# Patient Record
Sex: Female | Born: 1940 | Race: Black or African American | Hispanic: No | Marital: Single | Smoking: Never smoker
Health system: Southern US, Community
[De-identification: ages and names within clinical notes are randomized; demographics above are authoritative.]

---

## 2014-05-25 ENCOUNTER — Emergency Department (HOSPITAL_COMMUNITY): Payer: Self-pay

## 2014-05-25 ENCOUNTER — Encounter (HOSPITAL_COMMUNITY): Payer: Self-pay | Admitting: Emergency Medicine

## 2014-05-25 ENCOUNTER — Emergency Department (HOSPITAL_COMMUNITY)
Admission: EM | Admit: 2014-05-25 | Discharge: 2014-05-25 | Disposition: A | Discharge status: Home / Self Care | Payer: Self-pay | Attending: Emergency Medicine | Admitting: Emergency Medicine

## 2014-05-25 DIAGNOSIS — R630 Anorexia: Secondary | ICD-10-CM | POA: Insufficient documentation

## 2014-05-25 DIAGNOSIS — H578 Other specified disorders of eye and adnexa: Secondary | ICD-10-CM | POA: Insufficient documentation

## 2014-05-25 DIAGNOSIS — R05 Cough: Secondary | ICD-10-CM | POA: Insufficient documentation

## 2014-05-25 DIAGNOSIS — R51 Headache: Secondary | ICD-10-CM | POA: Insufficient documentation

## 2014-05-25 DIAGNOSIS — N390 Urinary tract infection, site not specified: Secondary | ICD-10-CM | POA: Insufficient documentation

## 2014-05-25 DIAGNOSIS — R5383 Other fatigue: Secondary | ICD-10-CM | POA: Insufficient documentation

## 2014-05-25 LAB — URINE MICROSCOPIC-ADD ON

## 2014-05-25 LAB — CBC WITH DIFFERENTIAL/PLATELET
BASOS ABS: 0 10*3/uL (ref 0.0–0.1)
BASOS PCT: 0 % (ref 0–1)
EOS PCT: 0 % (ref 0–5)
Eosinophils Absolute: 0 10*3/uL (ref 0.0–0.7)
HCT: 37.2 % (ref 36.0–46.0)
Hemoglobin: 12.3 g/dL (ref 12.0–15.0)
Lymphocytes Relative: 21 % (ref 12–46)
Lymphs Abs: 0.8 10*3/uL (ref 0.7–4.0)
MCH: 27.1 pg (ref 26.0–34.0)
MCHC: 33.1 g/dL (ref 30.0–36.0)
MCV: 81.9 fL (ref 78.0–100.0)
Monocytes Absolute: 0.6 10*3/uL (ref 0.1–1.0)
Monocytes Relative: 15 % — ABNORMAL HIGH (ref 3–12)
NEUTROS ABS: 2.5 10*3/uL (ref 1.7–7.7)
Neutrophils Relative %: 64 % (ref 43–77)
Platelets: 141 10*3/uL — ABNORMAL LOW (ref 150–400)
RBC: 4.54 MIL/uL (ref 3.87–5.11)
RDW: 13.4 % (ref 11.5–15.5)
WBC: 3.9 10*3/uL — AB (ref 4.0–10.5)

## 2014-05-25 LAB — URINALYSIS, ROUTINE W REFLEX MICROSCOPIC
Bilirubin Urine: NEGATIVE
Glucose, UA: NEGATIVE mg/dL
Ketones, ur: 15 mg/dL — AB
Nitrite: NEGATIVE
PH: 6 (ref 5.0–8.0)
Protein, ur: 30 mg/dL — AB
SPECIFIC GRAVITY, URINE: 1.019 (ref 1.005–1.030)
Urobilinogen, UA: 1 mg/dL (ref 0.0–1.0)

## 2014-05-25 LAB — COMPREHENSIVE METABOLIC PANEL
ALBUMIN: 3.4 g/dL — AB (ref 3.5–5.2)
ALT: 21 U/L (ref 0–35)
AST: 34 U/L (ref 0–37)
Alkaline Phosphatase: 63 U/L (ref 39–117)
Anion gap: 8 (ref 5–15)
BUN: 10 mg/dL (ref 6–23)
CALCIUM: 8.7 mg/dL (ref 8.4–10.5)
CO2: 25 mmol/L (ref 19–32)
CREATININE: 1.05 mg/dL (ref 0.50–1.10)
Chloride: 102 mEq/L (ref 96–112)
GFR calc Af Amer: 60 mL/min — ABNORMAL LOW (ref >90)
GFR, EST NON AFRICAN AMERICAN: 52 mL/min — AB (ref >90)
Glucose, Bld: 106 mg/dL — ABNORMAL HIGH (ref 70–99)
Potassium: 3.8 mmol/L (ref 3.5–5.1)
SODIUM: 135 mmol/L (ref 135–145)
Total Bilirubin: 0.3 mg/dL (ref 0.3–1.2)
Total Protein: 7.5 g/dL (ref 6.0–8.3)

## 2014-05-25 MED ORDER — DEXTROSE 5 % IV SOLN
1.0000 g | Freq: Once | INTRAVENOUS | Status: AC
  Administered 2014-05-25: 1 g via INTRAVENOUS
  Filled 2014-05-25: qty 10

## 2014-05-25 MED ORDER — ACETAMINOPHEN 325 MG PO TABS
650.0000 mg | ORAL_TABLET | Freq: Once | ORAL | Status: AC
  Administered 2014-05-25: 650 mg via ORAL
  Filled 2014-05-25: qty 2

## 2014-05-25 MED ORDER — CEPHALEXIN 500 MG PO CAPS: 500.0000 mg | ORAL_CAPSULE | Freq: Four times a day (QID) | ORAL | Status: AC

## 2014-05-25 NOTE — ED Notes (Signed)
Pt. reports headache for 3 days with occasional dry cough and fatigue , denies fever or chills, respirations unlabored .

## 2014-05-25 NOTE — ED Notes (Signed)
Unable to obtain a translator with our services for the language [place]. Pt's friend at bedside reports the pt's HA and generalized weakness started on Wednesday, pt has a dry cough and a loss of appetite. Per pt's friend pt denies problems voiding or with bowel movements, denies a sore throat, N/V/D.

## 2014-05-25 NOTE — ED Notes (Signed)
MD at bedside. 

## 2014-05-25 NOTE — ED Provider Notes (Signed)
CSN: [id number]     Arrival date & time 05/25/14  0121 History  This chart was scribed for Audree CamelScott T Renia Mikelson, MD by Abel PrestoKara Demonbreun, ED Scribe. This patient was seen in room D30C/D30C and the patient's care was started at 3:48 AM.    Chief Complaint  Patient presents with  . Headache  . Cough    The history is provided by the patient and a friend.    HPI Comments: Fransisca Kaufmannliza Nickson is a 73 y.o. female who presents to the Emergency Department complaining of fatigue and weakness with onset around 11:30 PM.  Pt's friend is here to translate.  Pt notes associated cough, headache, vomiting, low grade fever, and lack of appetite. She notes redness in the pt's right eye, but pt states she has had right eye trouble for 10 years.  [place] has been in the [place] since [place] and has not left the country since. She denies falling, sore throat, neck stiffness, rhinorrhea, dysuria, diarrhea, back pain, chest pain.   History reviewed. No pertinent past medical history. History reviewed. No pertinent past surgical history. No family history on file. History  Substance Use Topics  . Smoking status: Never Smoker   . Smokeless tobacco: Not on file  . Alcohol Use: No   OB History    No data available     Review of Systems  Constitutional: Positive for fever, appetite change and fatigue.  HENT: Negative for rhinorrhea and sore throat.   Eyes: Positive for redness.  Respiratory: Positive for cough.   Cardiovascular: Negative for chest pain.  Gastrointestinal: Positive for vomiting. Negative for diarrhea.  Genitourinary: Negative for dysuria.  Musculoskeletal: Negative for back pain.  Neurological: Positive for weakness and headaches.      Allergies  Review of patient's allergies indicates no known allergies.  Home Medications   Prior to Admission medications   Not on File   BP 135/53 mmHg  Pulse 74  Temp(Src) 100.3 F (37.9 C) (Oral)  Resp 25  SpO2 96% Physical Exam  Constitutional: She  is oriented to person, place, and time. She appears well-developed and well-nourished.  HENT:  Head: Normocephalic and atraumatic.  Right Ear: External ear normal.  Left Ear: External ear normal.  Nose: Nose normal.  Eyes: Conjunctivae and EOM are normal.  Neck: Normal range of motion. Neck supple.  Full passive and active ROM without pain  Cardiovascular: Normal rate, regular rhythm and normal heart sounds.  Exam reveals no friction rub.   No murmur heard. Pulmonary/Chest: Effort normal and breath sounds normal. No respiratory distress. She has no wheezes. She has no rales.  Abdominal: Soft. Bowel sounds are normal. She exhibits no distension. There is no tenderness. There is no rebound and no guarding.  Musculoskeletal: Normal range of motion.  Neurological: She is alert and oriented to person, place, and time.  Cranial nerves II-XII grossly intact 5/5 strength in all extremities  Skin: Skin is warm and dry.  Psychiatric: She has a normal mood and affect. Her behavior is normal.  Nursing note and vitals reviewed.   ED Course  Procedures (including critical care time) DIAGNOSTIC STUDIES: Oxygen Saturation is *96 on room air, normal by my interpretation.    COORDINATION OF CARE: 3:54 AM Discussed treatment plan with patient at beside, the patient agrees with the plan and has no further questions at this time.   Labs Review Labs Reviewed  CBC WITH DIFFERENTIAL - Abnormal; Notable for the following:    WBC 3.9 (*)  Platelets 141 (*)    Monocytes Relative 15 (*)    All other components within normal limits  COMPREHENSIVE METABOLIC PANEL - Abnormal; Notable for the following:    Glucose, Bld 106 (*)    Albumin 3.4 (*)    GFR calc non Af Amer 52 (*)    GFR calc Af Amer 60 (*)    All other components within normal limits  URINALYSIS, ROUTINE W REFLEX MICROSCOPIC - Abnormal; Notable for the following:    APPearance CLOUDY (*)    Hgb urine dipstick LARGE (*)    Ketones, ur 15  (*)    Protein, ur 30 (*)    Leukocytes, UA MODERATE (*)    All other components within normal limits  URINE MICROSCOPIC-ADD ON - Abnormal; Notable for the following:    Squamous Epithelial / LPF MANY (*)    Bacteria, UA MANY (*)    Casts GRANULAR CAST (*)    All other components within normal limits  URINE CULTURE    Imaging Review Dg Chest 2 View  05/25/2014   CLINICAL DATA:  Cough, vomiting, fever, fatigue and weakness for 4 days.  EXAM: CHEST  2 VIEW  COMPARISON:  None.  FINDINGS: Cardiac silhouette is mildly enlarged. Calcified aortic knob, no mediastinal widening. Mild bronchitic changes. No pleural effusions or focal consolidations. No pneumothorax. Soft tissue planes and included osseous structures are nonsuspicious, mild degenerative changes thoracic spine.  IMPRESSION: Mild cardiomegaly, mild bronchitic changes.  No focal consolidation.   Electronically Signed   By: Awilda Metroourtnay  Bloomer   On: 05/25/2014 05:29     EKG Interpretation None      MDM   Final diagnoses:  UTI (lower urinary tract infection)   Patient is well appearing here, has normal neurologic exam and is awake and alert. Chest x-ray negative for any pneumonia, urine is dirty catch but still concerning for possible UTI. Due to this, will treat with Keflex and recommend symptomatically including fluids and follow-up with a PCP. I have very low suspicion for a meningitis or encephalitis as a cause of her fever.   I personally performed the services described in this documentation, which was scribed in my presence. The recorded information has been reviewed and is accurate.    Audree CamelScott T Star Cheese, MD 05/25/14 [phone]

## 2014-05-25 NOTE — ED Notes (Signed)
Discharge instructions reviewed with family member/care taker. No further questions at this time.

## 2014-05-25 NOTE — ED Notes (Signed)
Patient transported to X-ray 

## 2014-05-26 LAB — URINE CULTURE: Colony Count: 30000

## 2016-04-17 IMAGING — CR DG CHEST 2V
2 series · 2 of 2 positions shown · non-contrast
Comparison: None.

CLINICAL DATA: Cough, vomiting, fever, fatigue and weakness for 4
days.

EXAM:
CHEST  2 VIEW

[chest pa]
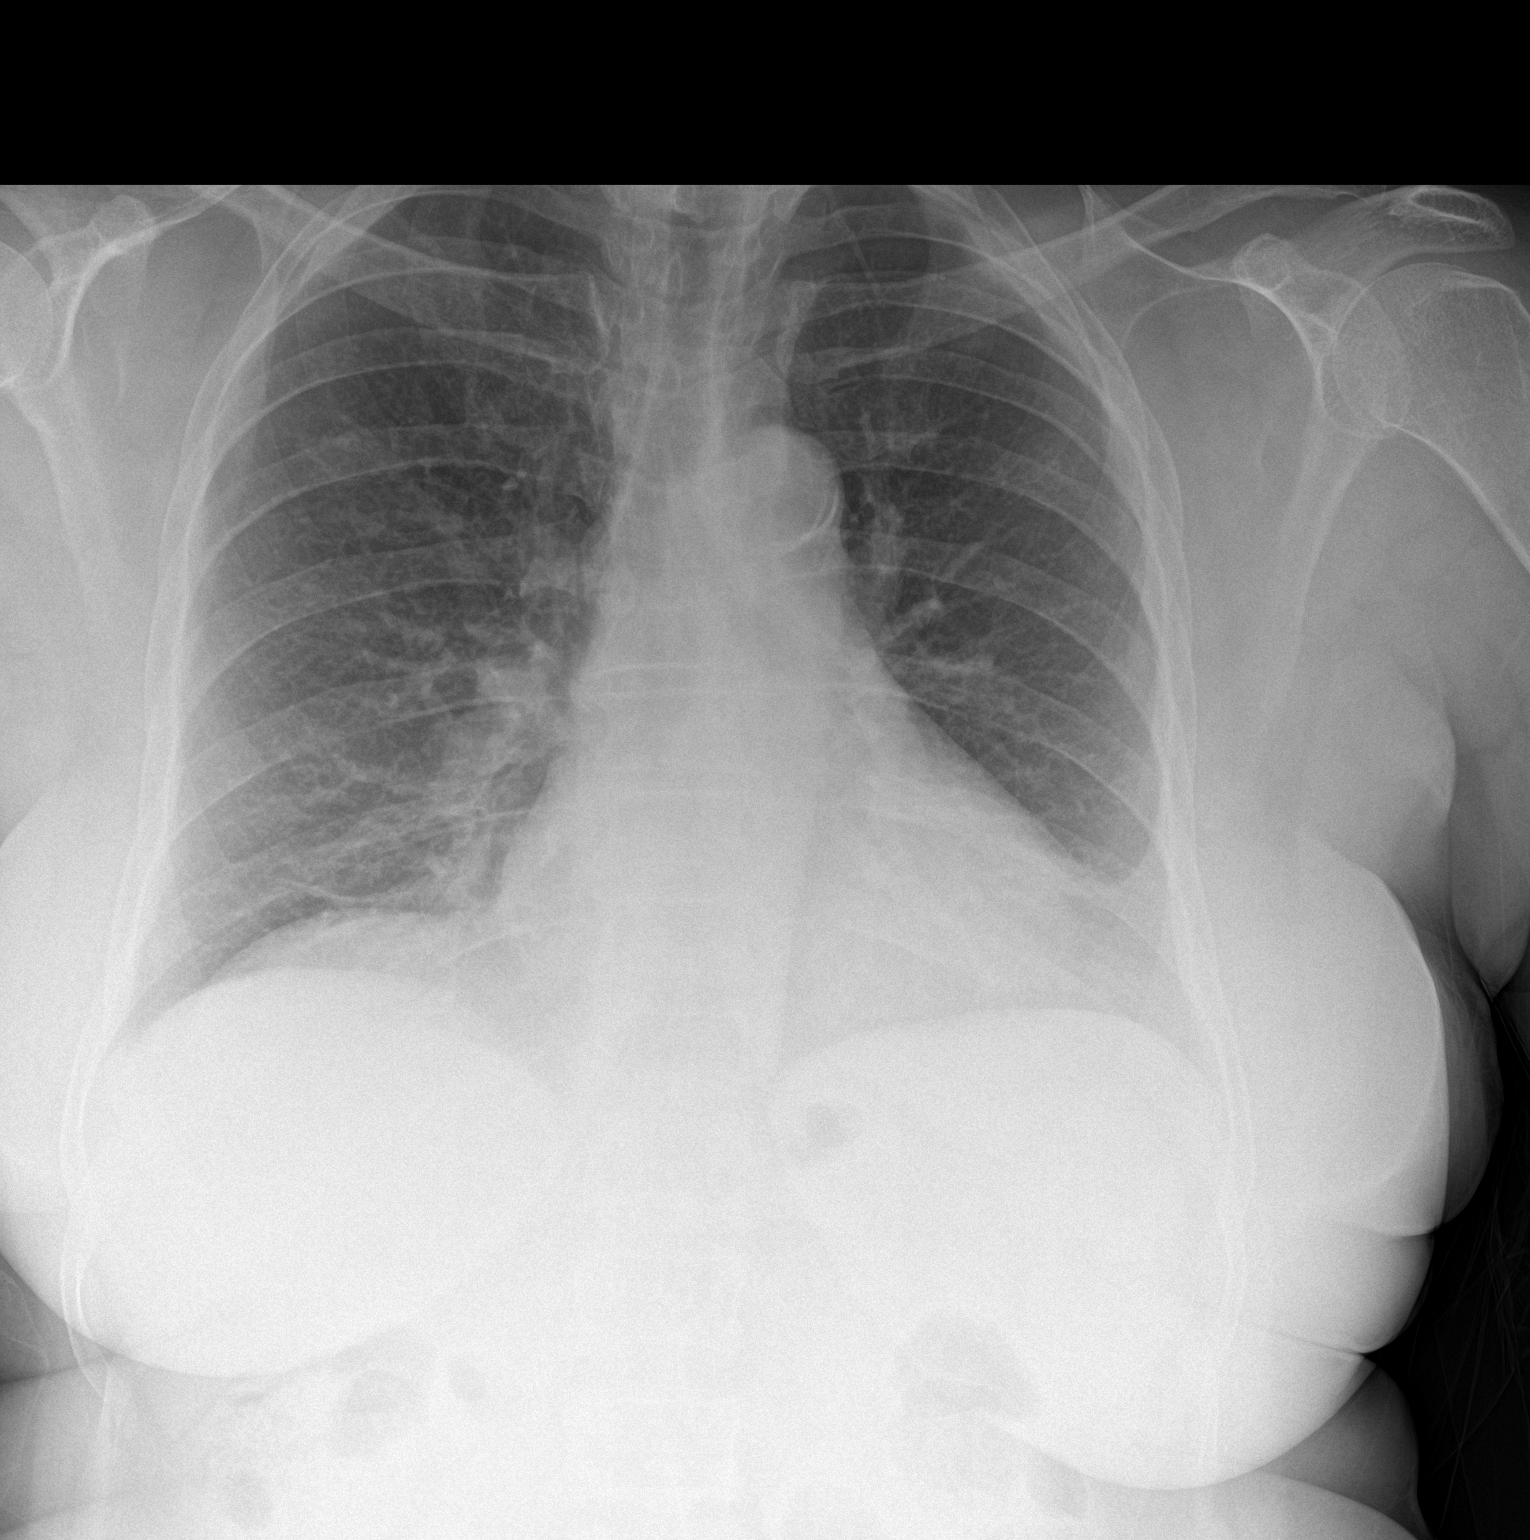

[chest lat]
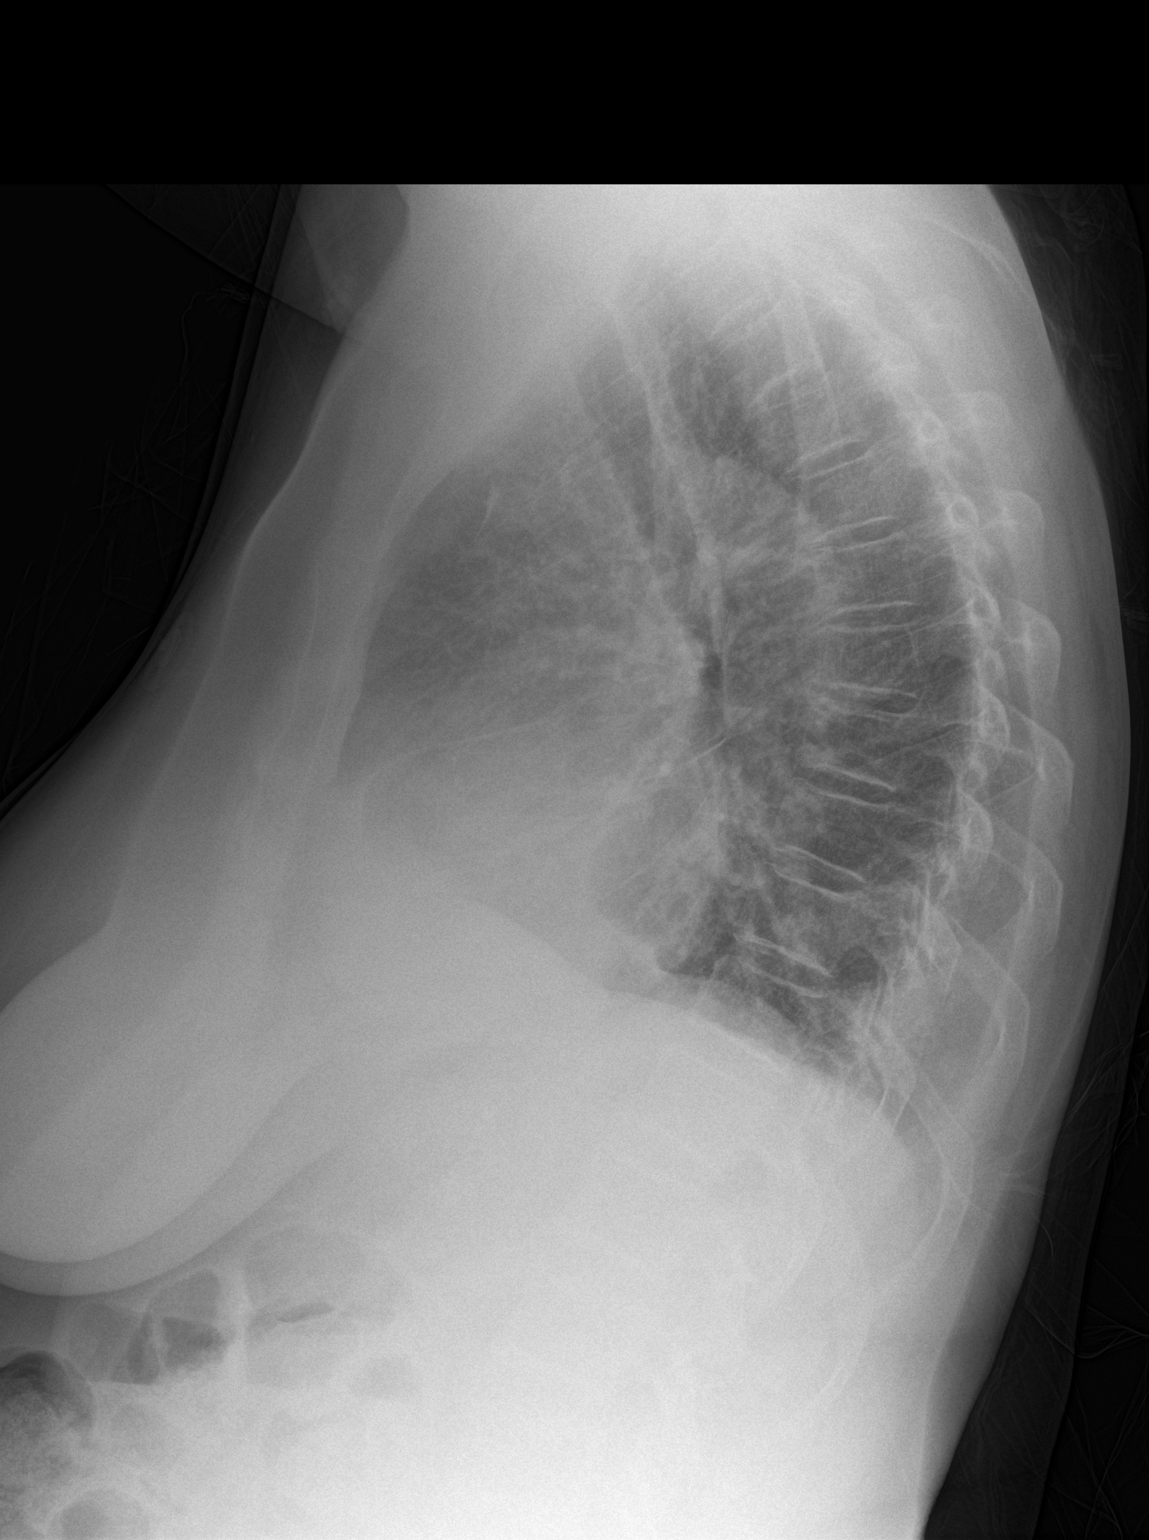

[2 of 2 positions shown; findings below may reference images not displayed]

FINDINGS: Cardiac silhouette is mildly enlarged. Calcified aortic knob, no
mediastinal widening. Mild bronchitic changes. No pleural effusions
or focal consolidations. No pneumothorax. Soft tissue planes and
included osseous structures are nonsuspicious, mild degenerative
changes thoracic spine.
IMPRESSION: Mild cardiomegaly, mild bronchitic changes.  No focal consolidation.

  By: Ingunn Harpa Ronlor
